# Patient Record
Sex: Male | Born: 1990 | Race: White | Hispanic: No | Marital: Single | Smoking: Never smoker
Health system: Southern US, Community
[De-identification: ages and names within clinical notes are randomized; demographics above are authoritative.]

---

## 2020-02-20 ENCOUNTER — Encounter: Payer: Self-pay | Admitting: Neurology

## 2020-05-18 ENCOUNTER — Ambulatory Visit: Payer: BC Managed Care – PPO | Admitting: Neurology

## 2020-08-17 ENCOUNTER — Ambulatory Visit: Payer: BC Managed Care – PPO | Admitting: Neurology

## 2021-02-18 ENCOUNTER — Encounter: Payer: Self-pay | Admitting: Neurology

## 2021-04-26 ENCOUNTER — Other Ambulatory Visit: Payer: Self-pay

## 2021-04-26 ENCOUNTER — Ambulatory Visit (INDEPENDENT_AMBULATORY_CARE_PROVIDER_SITE_OTHER): Payer: BC Managed Care – PPO | Admitting: Neurology

## 2021-04-26 ENCOUNTER — Encounter: Payer: Self-pay | Admitting: Neurology

## 2021-04-26 VITALS — BP 121/78 | HR 68 | Ht 72.0 in | Wt 247.0 lb

## 2021-04-26 DIAGNOSIS — R29898 Other symptoms and signs involving the musculoskeletal system: Secondary | ICD-10-CM | POA: Diagnosis not present

## 2021-04-26 DIAGNOSIS — R261 Paralytic gait: Secondary | ICD-10-CM | POA: Diagnosis not present

## 2021-04-26 DIAGNOSIS — G959 Disease of spinal cord, unspecified: Secondary | ICD-10-CM

## 2021-04-26 DIAGNOSIS — R258 Other abnormal involuntary movements: Secondary | ICD-10-CM

## 2021-04-26 MED ORDER — BACLOFEN 10 MG PO TABS: ORAL_TABLET | ORAL | 3 refills | Status: AC

## 2021-04-26 NOTE — Progress Notes (Signed)
[facility] Clinic Note - Initial Visit   Date: 04/26/21  Wesley Velasquez MRN: [id number] DOB: [birth year]   Dear Archie Patten Cranford,NP:   Thank you for your kind referral of Wesley Velasquez for consultation of right side numbness. Although his history is well known to you, please allow [place] to reiterate it for the purpose of our medical record. The patient was accompanied to the clinic by fiance who also provides collateral information.     History of Present Illness: Wesley Velasquez is a 31 y.o. right-handed male presenting for evaluation of right arm and leg numbness. For the past 10 years, he complains of numbness and pain involving the right arm and leg. Pain is worse with exertion.  Numbness and tingling is constant involving the forearm, hand, thigh, lower legs, and feet.  He complains of weakness involving the hand and difficulty holding items. He is falling multiple times daily, he is able stand up from his falls, but feels that any uneven ground can trigger a fall and he is unable to break it.  He walks unassisted, and legs feel stiff.  He endorses leg cramps. He has difficulty with urinary retention as well as urinary/bowel incontinence.He also has reduced senstaion over the left hand.    He was offered gabapentin, but it did not help, so stopped it.   He works as a Dispensing optician. Nonsmoker, doesnot drink alcohol.  He lives with his fiance and two children in a one-level home.  Past medical history; None   Medications:  Outpatient Encounter Medications as of 04/26/2021  Medication Sig   baclofen (LIORESAL) 10 MG tablet Start balcofen 5mg  (half-tablet) at bedtime x 1 week, then increase to half tablet twice daily.   No facility-administered encounter medications on file as of 04/26/2021.    Allergies: No Known Allergies  Family History: Family History  Problem Relation Age of Onset   Edema Mother    Diabetes Mother    Hypertension  Mother    Clotting disorder Mother    Anemia Mother    Diabetes Father    Hypertension Father    Neuropathy Father        Left Hand    Social History: Social History   Tobacco Use   Smoking status: Never   Smokeless tobacco: Never  Substance Use Topics   Alcohol use: Not Currently   Drug use: Never   Social History   Social History Narrative   Right Handed    Lives in an apartment     Vital Signs:  BP 121/78    Pulse 68    Ht 6' (1.829 m)    Wt 247 lb (112 kg)    SpO2 97%    BMI 33.50 kg/m   General Medical Exam:   General:  Well appearing, comfortable  Eyes/ENT: see cranial nerve examination.   Neck:   No carotid bruits. Respiratory:  Clear to auscultation, good air entry bilaterally.   Cardiac:  Regular rate and rhythm, no murmur.   Ext:  No edema  Neurological Exam: MENTAL STATUS including orientation to time, place, person, recent and remote memory, attention span and concentration, language, and fund of knowledge is normal.  Speech is not dysarthric.  CRANIAL NERVES: II:  No visual field defects.   III-IV-VI: Pupils equal round and reactive to light.  Normal conjugate, extra-ocular eye movements in all directions of gaze.  No nystagmus.  No ptosis.   V:  Normal facial sensation.  VII:  Normal facial symmetry and movements.  Jaw jerk, snout, and palmomental reflex is absent VIII:  Normal hearing and vestibular function.   IX-X:  Normal palatal movement.   XI:  Normal shoulder shrug and head rotation.   XII:  Normal tongue strength and range of motion, no deviation or fasciculation.  MOTOR:  Mild right hand muscle atrophy.  No fasciculations or abnormal movements.  No pronator drift.   Upper Extremity:  Right  Left  Deltoid  5/5   5/5   Biceps  5/5   5/5   Triceps  5/5   5/5   Infraspinatus 5/5  5/5  Medial pectoralis 5/5  5/5  Wrist extensors  5/5   5/5   Wrist flexors  5/5   5/5   Finger extensors  4/5   5-/5   Finger flexors  4/5   5-/5   Dorsal  interossei  4/5   5-/5   Abductor pollicis  4/5   5-/5   Tone (Ashworth scale)  0+  0+   Lower Extremity:  Right  Left  Hip flexors  5/5   5/5   Hip extensors  5/5   5/5   Adductor 5/5  5/5  Abductor 5/5  5/5  Knee flexors  5/5   5/5   Knee extensors  5/5   5/5   Dorsiflexors  5/5   5/5   Plantarflexors  5/5   5/5   Toe extensors  5/5   5/5   Toe flexors  5/5   5/5   Tone (Ashworth scale)  1+  1+   MSRs:  Right        Left                  brachioradialis 3+  3+  biceps 3+  3+  triceps 3+  3+  patellar 3+  3+  ankle jerk 3+  3+  Hoffman yes  yes  plantar response up  up  Bilateral sustained ankle clonus Spread of medial pectoralis reflex to finger flexors  SENSORY:  Diffusely absent sensation to temperature, pin prick, and vibration on the right arm and leg, as compared to the left.   COORDINATION/GAIT: Normal finger-to- nose-finger.  Finger tapping is severely slowed with reduced amplitude. Spastic appearing gait, slow, unsteady  IMPRESSION: Cervical myelopathy manifesting with progressive right side hemisensory loss, upper extremity weakness, autonomic dysfunction, with exam showing prominent upper motor neuron findings.  Need to obtain ASAP MRI cervical spine wwo contrast to evaluate for compressive pathology vs other cord disease such as demyelinating disease.   Start baclofen 5mg  at bedtime x 1 week, then increase to 5mg  BID.  Titrate further as needed for spasticity Fall precautions discussed  Further recommendations pending results.   Thank you for allowing me to participate in patient's care.  If I can answer any additional questions, I would be pleased to do so.    Sincerely,    Jaleya Pebley K. , DO

## 2021-04-26 NOTE — Patient Instructions (Addendum)
MRI cervical spine wwo contrast  Start baclofen 5mg  at bedtime x 1 week, then increase to 5mg  twice daily

## 2021-04-29 ENCOUNTER — Ambulatory Visit
Admission: RE | Admit: 2021-04-29 | Discharge: 2021-04-29 | Disposition: A | Discharge status: Home / Self Care | Payer: BC Managed Care – PPO | Source: Ambulatory Visit | Attending: Neurology | Admitting: Neurology

## 2021-04-29 ENCOUNTER — Other Ambulatory Visit: Payer: Self-pay | Admitting: Neurology

## 2021-04-29 ENCOUNTER — Telehealth: Payer: Self-pay

## 2021-04-29 ENCOUNTER — Telehealth: Payer: Self-pay | Admitting: Neurology

## 2021-04-29 ENCOUNTER — Other Ambulatory Visit: Payer: Self-pay

## 2021-04-29 DIAGNOSIS — R29898 Other symptoms and signs involving the musculoskeletal system: Secondary | ICD-10-CM

## 2021-04-29 DIAGNOSIS — R258 Other abnormal involuntary movements: Secondary | ICD-10-CM

## 2021-04-29 DIAGNOSIS — R937 Abnormal findings on diagnostic imaging of other parts of musculoskeletal system: Secondary | ICD-10-CM

## 2021-04-29 DIAGNOSIS — G959 Disease of spinal cord, unspecified: Secondary | ICD-10-CM

## 2021-04-29 DIAGNOSIS — R261 Paralytic gait: Secondary | ICD-10-CM

## 2021-04-29 DIAGNOSIS — G939 Disorder of brain, unspecified: Secondary | ICD-10-CM

## 2021-04-29 IMAGING — CR DG ORBITS FOR FOREIGN BODY
2 series · 2 of 2 positions shown · non-contrast
Comparison: None.

CLINICAL DATA: Metal working/exposure; clearance prior to MRI

EXAM:
ORBITS FOR FOREIGN BODY - 2 VIEW

[w orbit pa (1 of 2)]
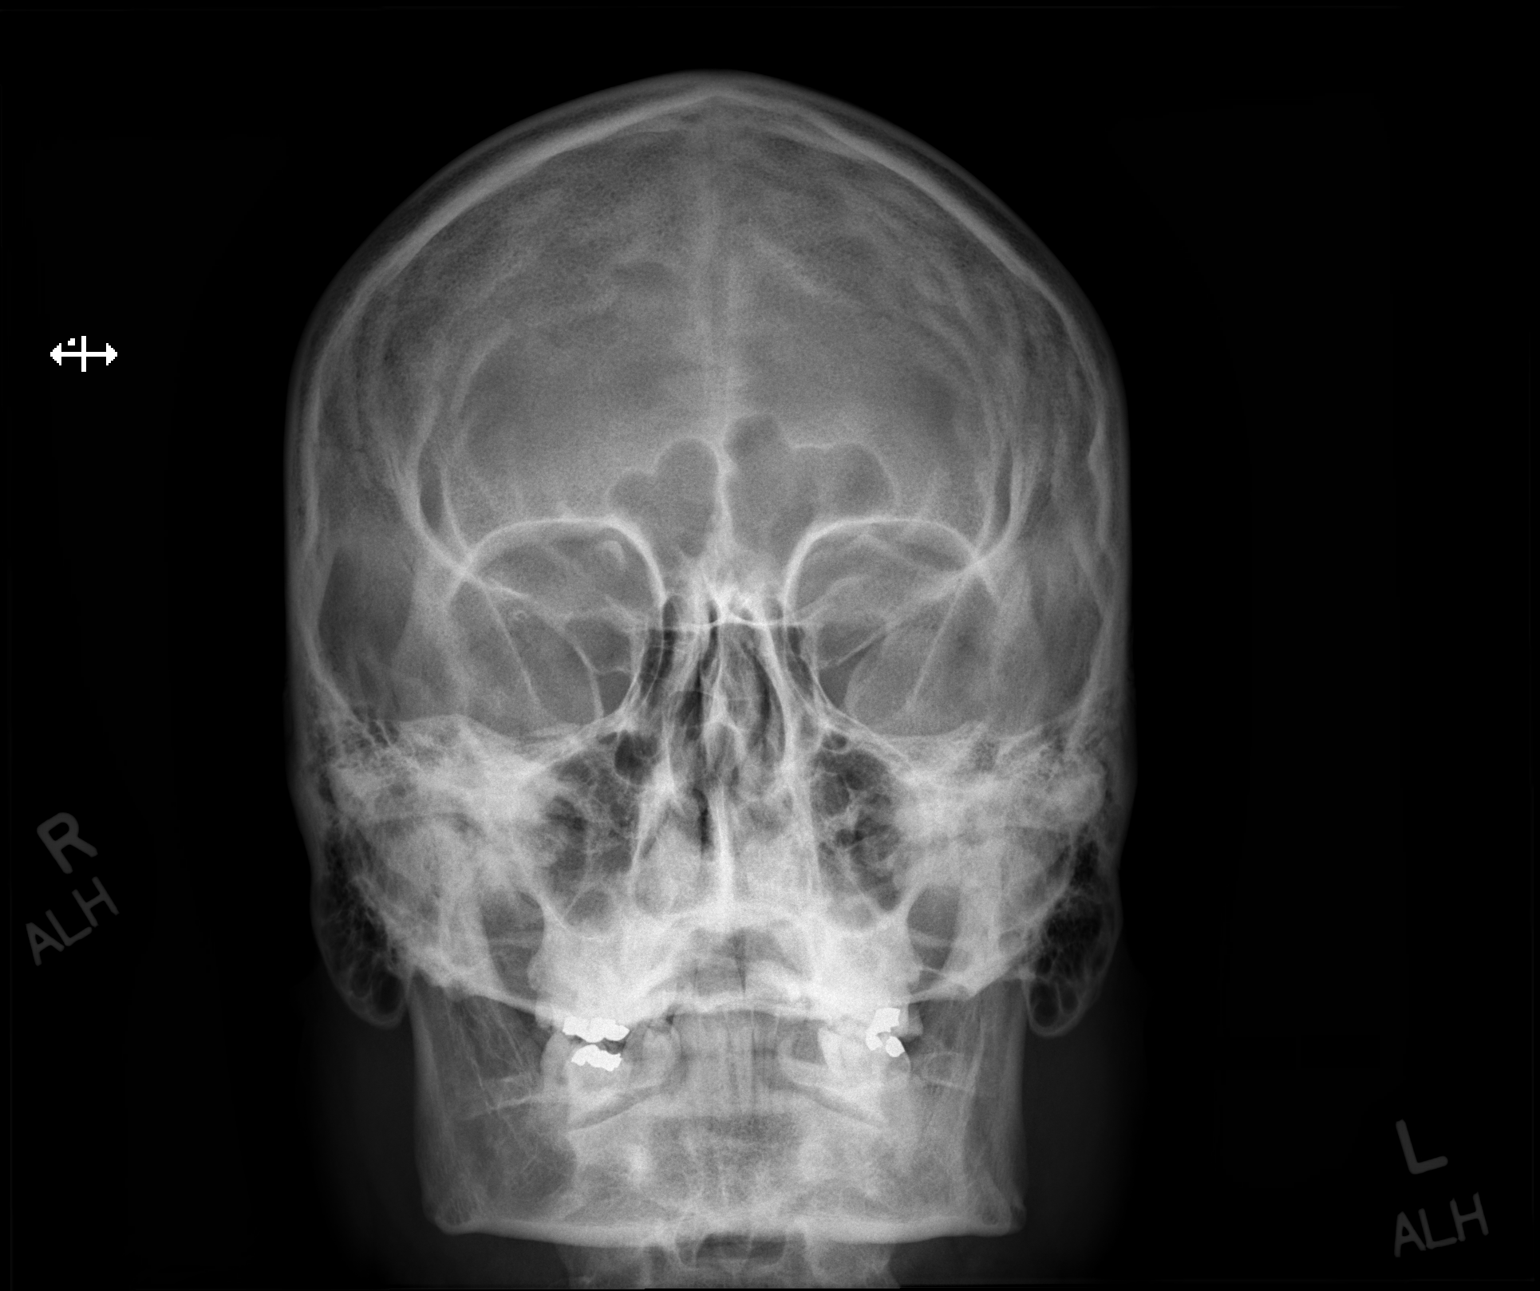

[w orbit pa (2 of 2)]
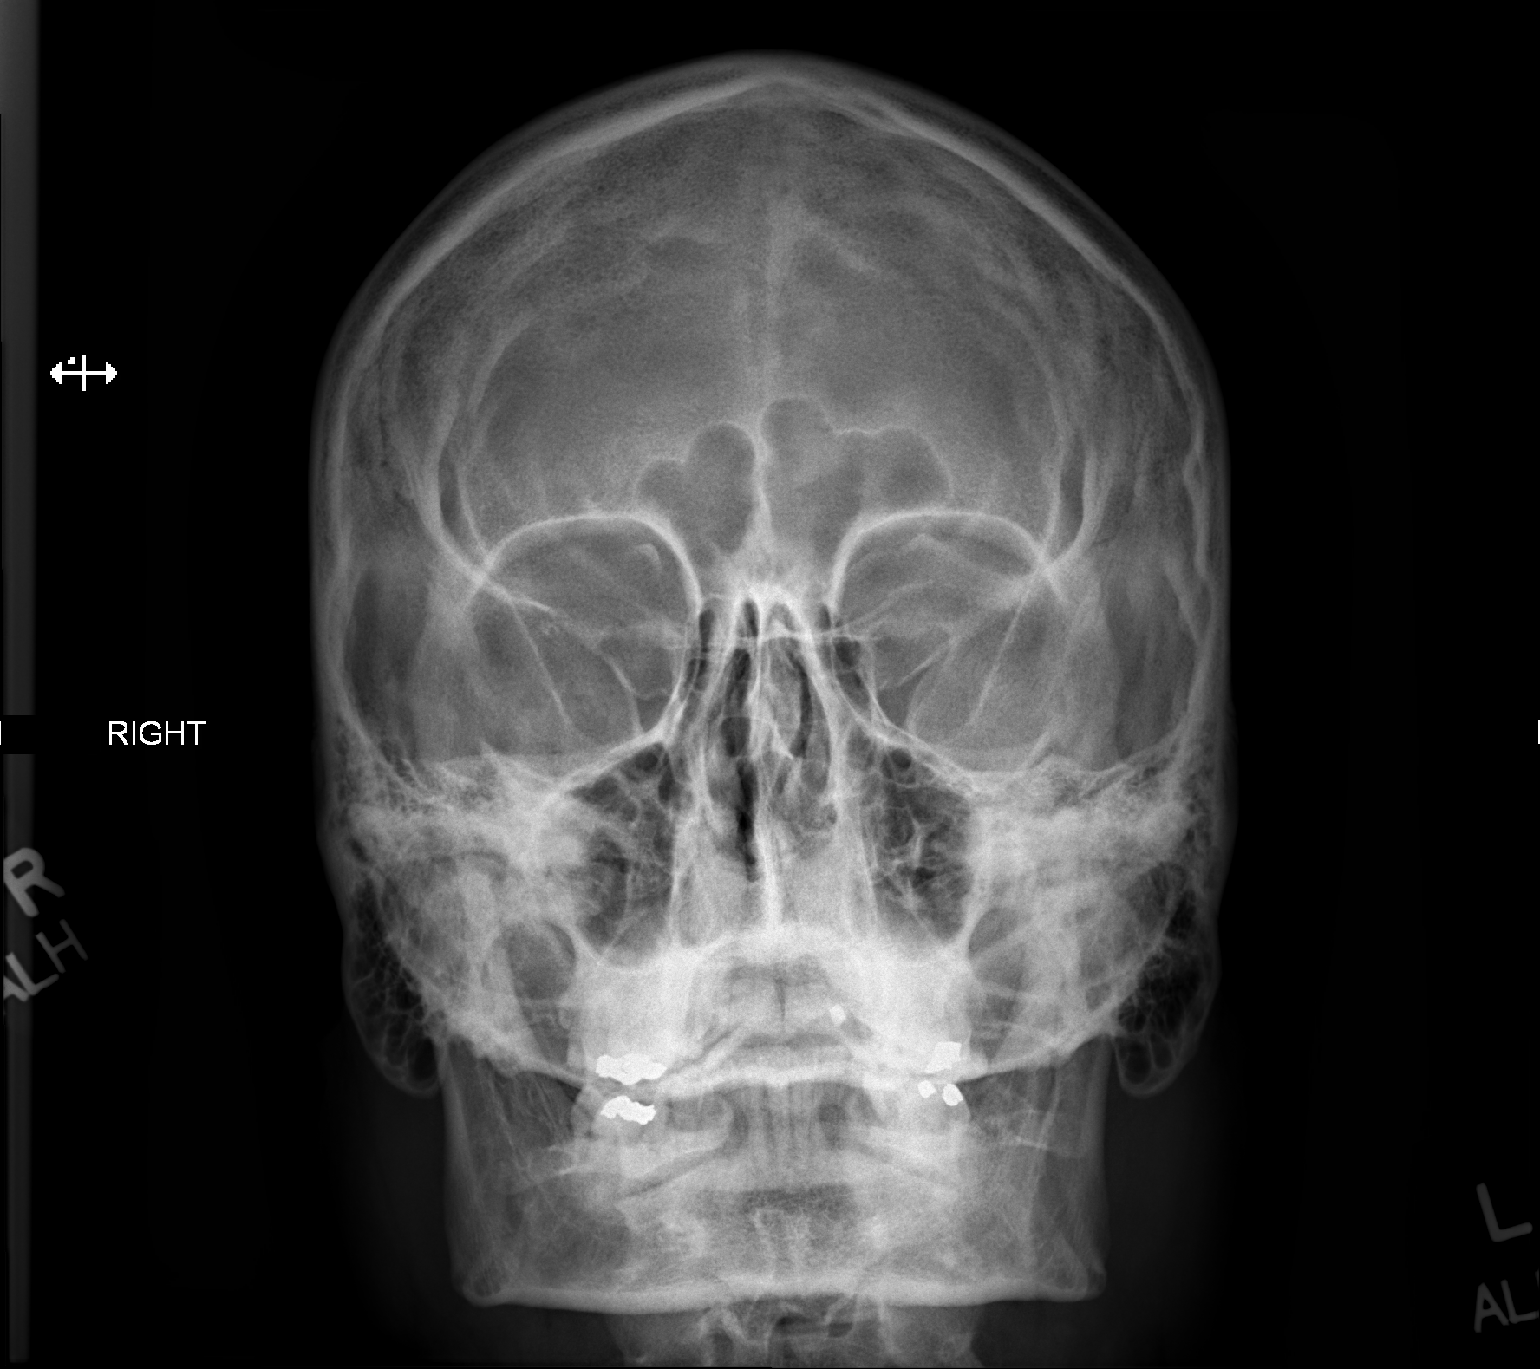

[2 of 2 positions shown; findings below may reference images not displayed]

FINDINGS: There is no evidence of metallic foreign body within the orbits. No
significant bone abnormality identified.
IMPRESSION: No evidence of metallic foreign body within the orbits.

## 2021-04-29 IMAGING — MR MR CERVICAL SPINE WO/W CM
4 of 8 series · 24 of 48 positions shown · IV contrast (20 ml multihance)
Comparison: None.
COMPARISON: None.

Addendum:
CLINICAL DATA: Myelopathy, acute, cervical spine

EXAM:
MRI CERVICAL SPINE WITHOUT AND WITH CONTRAST
TECHNIQUE: Multiplanar and multiecho pulse sequences of the cervical spine, to
include the craniocervical junction and cervicothoracic junction,
were obtained without and with intravenous contrast.
CONTRAST:  20mL MULTIHANCE GADOBENATE DIMEGLUMINE 529 MG/ML IV SOLN

[Series 5: T1 · sagittal · 3.0mm · 0.66mm/px · 4 of 15 slices shown (1 of 2)]
[im 1/15]
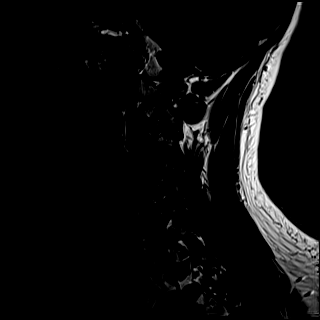
[im 5/15]
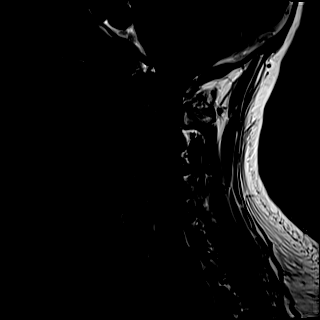
[im 10/15]
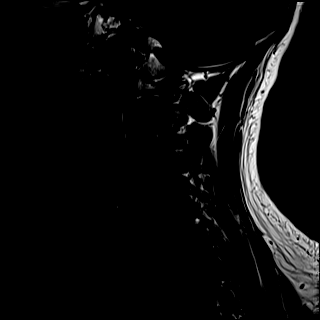
[im 15/15]
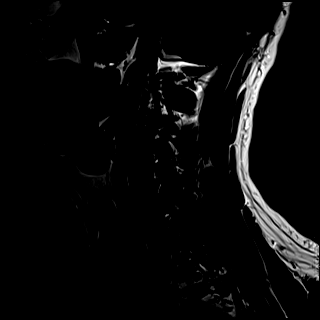

[Series 7: T2 · axial · 3.0mm · 0.50mm/px · z∈[-33,+73]mm · 8 of 34 slices shown]
[im 1/34]
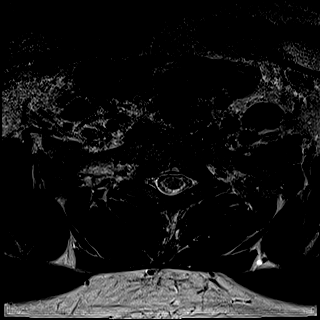
[im 5/34]
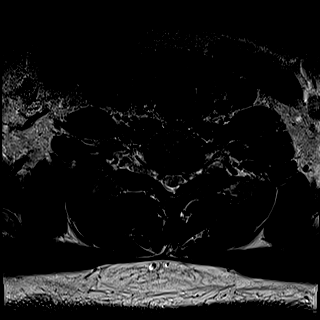
[im 10/34]
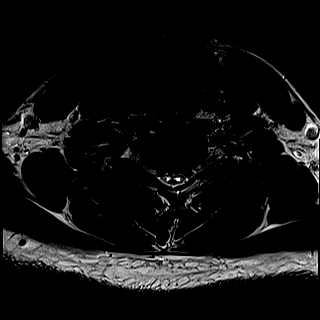
[im 15/34]
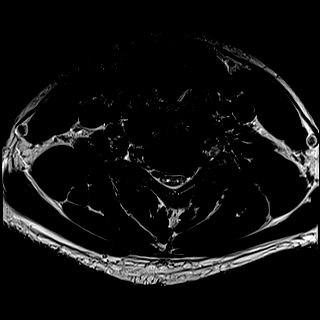
[im 19/34]
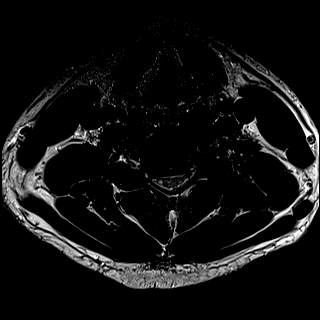
[im 24/34]
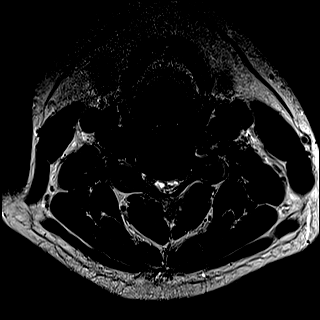
[im 29/34]
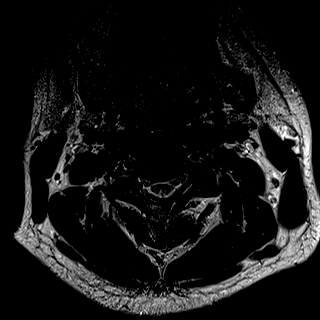
[im 34/34]
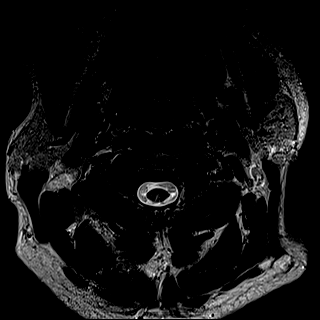

[Series 9: T1 · axial · non-contrast · 3.0mm · 0.31mm/px · z∈[-33,+73]mm · 8 of 34 slices shown (2 of 2)]
[im 1/34]
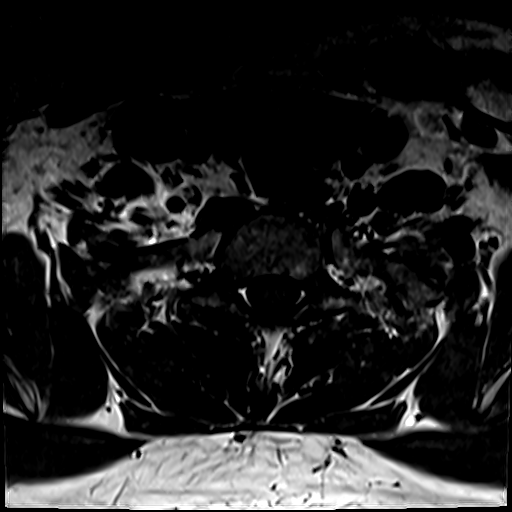
[im 5/34]
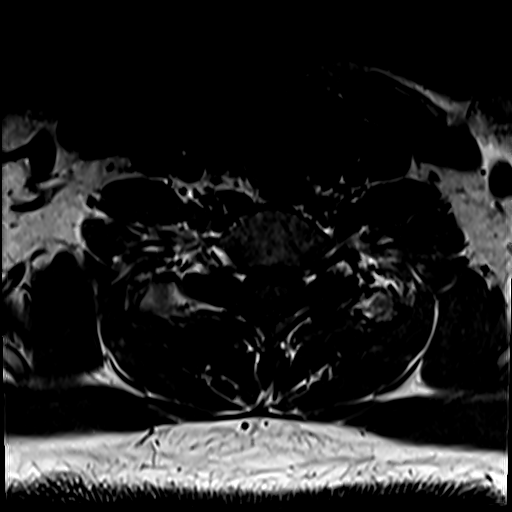
[im 10/34]
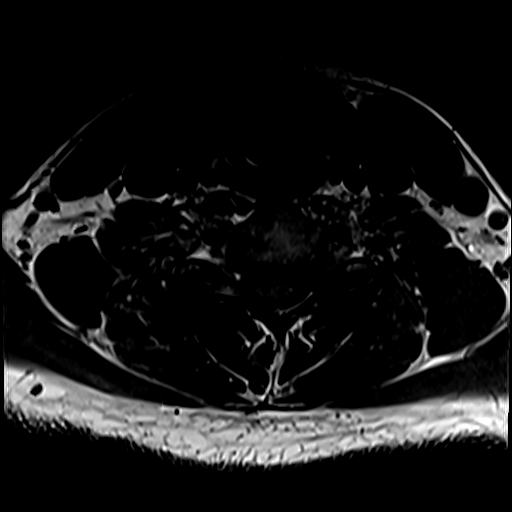
[im 15/34]
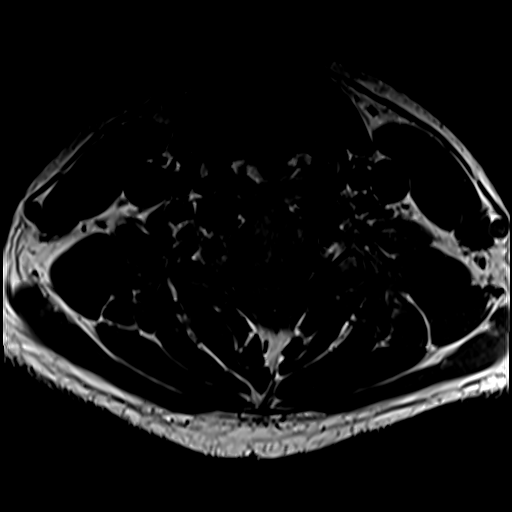
[im 19/34]
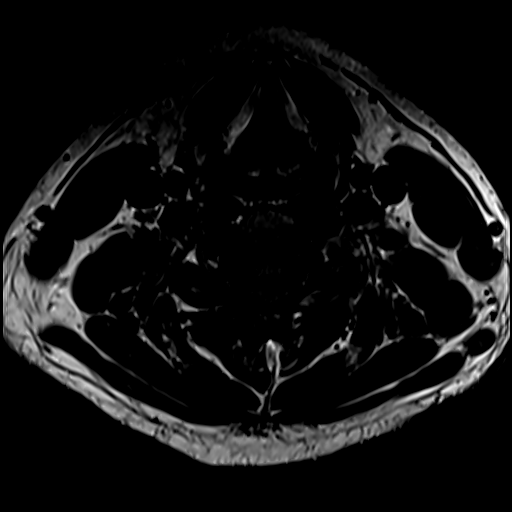
[im 24/34]
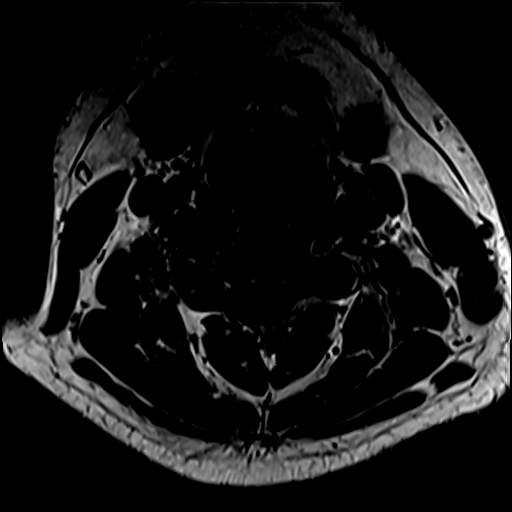
[im 29/34]
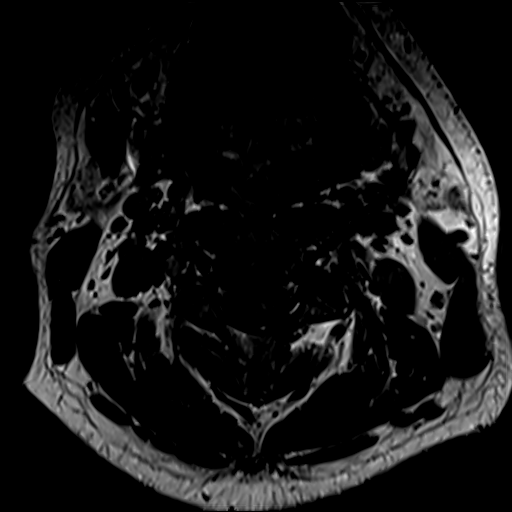
[im 34/34]
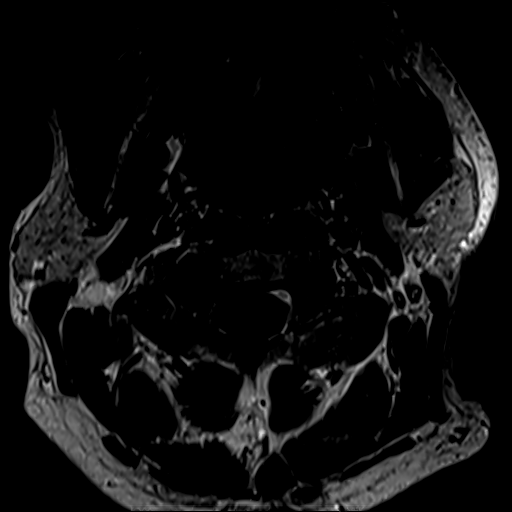

[Series 10: T2 post-contrast · sagittal · 3.0mm · 0.55mm/px · 4 of 15 slices shown]
[im 1/15]
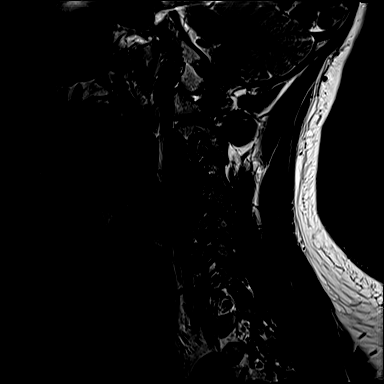
[im 5/15]
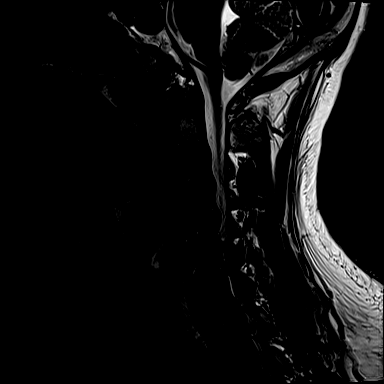
[im 10/15]
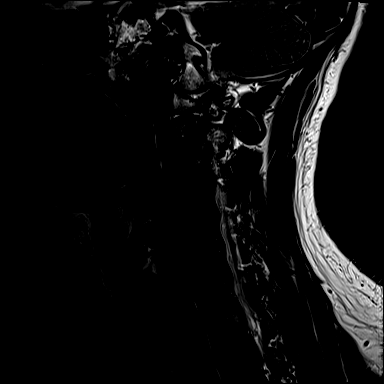
[im 15/15]
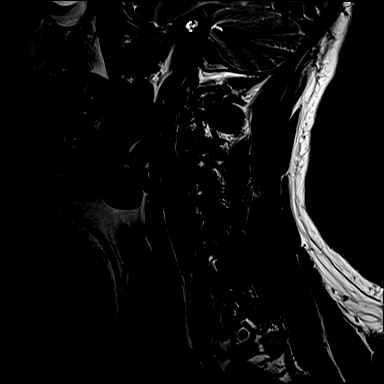

[24 of 48 positions shown; findings below may reference images not displayed]

FINDINGS: Alignment: Straightening of the normal cervical lordosis. No
substantial sagittal subluxation.

Vertebrae: Vertebral body heights are maintained. No focal marrow
edema to suggest acute fracture or discitis/osteomyelitis. No
suspicious bone lesions.

Cord: Abnormal T2/stir hyperintense signal in the cord, which is
most conspicuous at C3-C4, C5-C6 and C6-C7 with additional areas of
faint cord signal hyperintensity in the more superior and inferior
cervical cord. No abnormal cord enhancement. Cord atrophy at C3-C4
and at C6.

Posterior Fossa, vertebral arteries, paraspinal tissues: Visualized
vertebral artery flow voids are maintained. Unremarkable appearance
of the visualized posterior fossa on limited assessment.

Disc levels:

C2-C3: Posterior disc osteophyte complex with left greater than
right facet and uncovertebral hypertrophy. Ligamentum flavum
thickening. Resulting mild canal stenosis with moderate left
foraminal stenosis. No significant right foraminal stenosis.

C3-C4: Posterior disc osteophyte complex with left greater than
right facet and uncovertebral hypertrophy. Ligamentum flavum
thickening. Resulting moderate canal stenosis and severe left and
mild right foraminal stenosis.

C4-C5: Posterior disc osteophyte complex with bilateral facet and
uncovertebral hypertrophy. Resulting moderate bilateral foraminal
stenosis and mild to moderate canal stenosis.

C5-C6: Posterior disc osteophyte complex with left greater than
right facet and uncovertebral hypertrophy. Resulting mild to
moderate canal stenosis and mild left greater than right foraminal
stenosis.

C6-C7: Posterior disc osteophyte complex with mild to moderate canal
stenosis. Bilateral uncovertebral hypertrophy with mild to moderate
right and mild left foraminal stenosis.

C7-T1: No significant disc protrusion, foraminal stenosis, or canal
stenosis.
IMPRESSION: 1. Abnormal T2/STIR hyperintense signal in the cord, which is most
conspicuous at C3-C4, C5-C6 and C6-C7 with cord atrophy and
additional areas of faint cord signal hyperintensity in the more
superior and inferior posterior cervical cord. No abnormal cord
enhancement. Findings are most likely secondary to compressive
myelopathy with myelomalacia and potentially edema given the
correlate canal stenosis described below, but the degree of cord
signal abnormality is out of proportion to current stenosis and more
longitudinally extensive, possibly related to prior nonspecific
insult (such as cord infarct or trauma).
2. Moderate canal stenosis at C3-C4 and mild-to-moderate canal
stenosis at C4-C5, C5-C6 and C6-C7.
3. Severe left foraminal stenosis C3-C4. Moderate foraminal stenosis
on the left at C2-C3 and bilaterally at C4-C5. Mild-to-moderate
right foraminal stenosis C6-C7.

ADDENDUM:
Findings discussed with Dr. MATTIS via telephone at [DATE] p.m.

*** End of Addendum ***
FINDINGS: Alignment: Straightening of the normal cervical lordosis. No
substantial sagittal subluxation.

Vertebrae: Vertebral body heights are maintained. No focal marrow
edema to suggest acute fracture or discitis/osteomyelitis. No
suspicious bone lesions.

Cord: Abnormal T2/stir hyperintense signal in the cord, which is
most conspicuous at C3-C4, C5-C6 and C6-C7 with additional areas of
faint cord signal hyperintensity in the more superior and inferior
cervical cord. No abnormal cord enhancement. Cord atrophy at C3-C4
and at C6.

Posterior Fossa, vertebral arteries, paraspinal tissues: Visualized
vertebral artery flow voids are maintained. Unremarkable appearance
of the visualized posterior fossa on limited assessment.

Disc levels:

C2-C3: Posterior disc osteophyte complex with left greater than
right facet and uncovertebral hypertrophy. Ligamentum flavum
thickening. Resulting mild canal stenosis with moderate left
foraminal stenosis. No significant right foraminal stenosis.

C3-C4: Posterior disc osteophyte complex with left greater than
right facet and uncovertebral hypertrophy. Ligamentum flavum
thickening. Resulting moderate canal stenosis and severe left and
mild right foraminal stenosis.

C4-C5: Posterior disc osteophyte complex with bilateral facet and
uncovertebral hypertrophy. Resulting moderate bilateral foraminal
stenosis and mild to moderate canal stenosis.

C5-C6: Posterior disc osteophyte complex with left greater than
right facet and uncovertebral hypertrophy. Resulting mild to
moderate canal stenosis and mild left greater than right foraminal
stenosis.

C6-C7: Posterior disc osteophyte complex with mild to moderate canal
stenosis. Bilateral uncovertebral hypertrophy with mild to moderate
right and mild left foraminal stenosis.

C7-T1: No significant disc protrusion, foraminal stenosis, or canal
stenosis.
IMPRESSION: 1. Abnormal T2/STIR hyperintense signal in the cord, which is most
conspicuous at C3-C4, C5-C6 and C6-C7 with cord atrophy and
additional areas of faint cord signal hyperintensity in the more
superior and inferior posterior cervical cord. No abnormal cord
enhancement. Findings are most likely secondary to compressive
myelopathy with myelomalacia and potentially edema given the
correlate canal stenosis described below, but the degree of cord
signal abnormality is out of proportion to current stenosis and more
longitudinally extensive, possibly related to prior nonspecific
insult (such as cord infarct or trauma).
2. Moderate canal stenosis at C3-C4 and mild-to-moderate canal
stenosis at C4-C5, C5-C6 and C6-C7.
3. Severe left foraminal stenosis C3-C4. Moderate foraminal stenosis
on the left at C2-C3 and bilaterally at C4-C5. Mild-to-moderate
right foraminal stenosis C6-C7.

## 2021-04-29 MED ORDER — GADOBENATE DIMEGLUMINE 529 MG/ML IV SOLN
20.0000 mL | Freq: Once | INTRAVENOUS | Status: AC | PRN
  Administered 2021-04-29: 20 mL via INTRAVENOUS

## 2021-04-29 NOTE — Telephone Encounter (Signed)
Received call from Radiology requesting STAT peer to peer. Provided them with on call physician Dr. Rosalyn Gess cell phone number to call her for peer to peer.

## 2021-04-29 NOTE — Telephone Encounter (Signed)
Ruby from Dr Yetta Barre radiologist office called about this mutual patient.  They were calling to connect with Dr. Phone # [phone]

## 2021-04-29 NOTE — Telephone Encounter (Signed)
Returned call to Dr. Yetta Barre and discuss the results of his MRI findings. Patient with extensive cord changes and atrophy, concerning for prior insult. There is no abnormal enhancement. To be complete, I will order MRI brain with and without contrast and NMO antibody.

## 2021-04-30 ENCOUNTER — Other Ambulatory Visit: Payer: BC Managed Care – PPO

## 2021-04-30 NOTE — Telephone Encounter (Signed)
Orders have been placed and MRI has been faxed to [facility] imaging for Urgent appointment.

## 2021-04-30 NOTE — Telephone Encounter (Signed)
Called patient and informed him that MRI shows abnormalities in the cervical cord, which seem to be long-standing. It may be due to compression at these levels, but to be sure, there is no other process Informed patient that Dr. Allena Katz would like to check MRI brain with and without contrast and NMO antibody. Explained to patient that we are doing additional testing and will be in touch once we receive results.  Patient is aware to come to our office to check in for lab and was provided lab hours. Patient verbalized understanding of all explained to him and recommendations and had no further questions or concerns.

## 2021-04-30 NOTE — Addendum Note (Signed)
Addended by: Karl Luke A on: 04/30/2021 09:14 AM   Modules accepted: Orders

## 2021-05-03 LAB — NEUROMYELITIS OPTICA AUTOAB, IGG: NMO IgG Autoantibodies: <1.5 U/mL (ref 0.0–3.0)

## 2021-05-04 ENCOUNTER — Ambulatory Visit
Admission: RE | Admit: 2021-05-04 | Discharge: 2021-05-04 | Disposition: A | Discharge status: Home / Self Care | Payer: BC Managed Care – PPO | Source: Ambulatory Visit | Attending: Neurology | Admitting: Neurology

## 2021-05-04 ENCOUNTER — Other Ambulatory Visit: Payer: Self-pay

## 2021-05-04 DIAGNOSIS — R258 Other abnormal involuntary movements: Secondary | ICD-10-CM

## 2021-05-04 DIAGNOSIS — R261 Paralytic gait: Secondary | ICD-10-CM

## 2021-05-04 DIAGNOSIS — R29898 Other symptoms and signs involving the musculoskeletal system: Secondary | ICD-10-CM

## 2021-05-04 DIAGNOSIS — G939 Disorder of brain, unspecified: Secondary | ICD-10-CM

## 2021-05-04 IMAGING — MR MR HEAD WO/W CM
14 series · 48 of 48 positions shown · IV contrast (multihance)
Comparison: Head CT [DATE].  Cervical spine MRI [DATE].

CLINICAL DATA: Provided history: White-matter lesions of central
nervous system. Abnormal cervical MRI, extensive cord changes.
Spastic gait. Clonus. Right hand weakness. Additional history
provided by scanning technologist: Patient reports numbness on right
side of body from shoulders down, right hand weakness.

EXAM:
MRI HEAD WITHOUT AND WITH CONTRAST
TECHNIQUE: Multiplanar, multiecho pulse sequences of the brain and surrounding
structures were obtained without and with intravenous contrast.
CONTRAST:  20mL MULTIHANCE GADOBENATE DIMEGLUMINE 529 MG/ML IV SOLN

[Series 5: T1 · sagittal · 4.0mm · 0.75mm/px · 1 of 27 slices shown (1 of 3)]
[im 1/27]
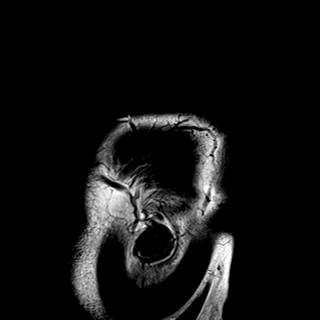

[Series 6: DWI · axial · 3.0mm · 0.94mm/px · z∈[-62,+83]mm · 8 of 166 slices shown (1 of 3)]
[im 1/166]
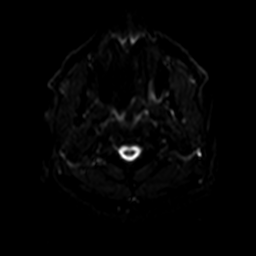
[im 24/166]
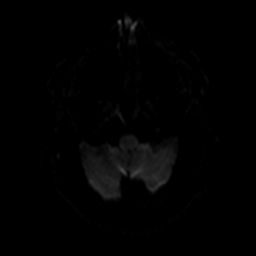
[im 48/166]
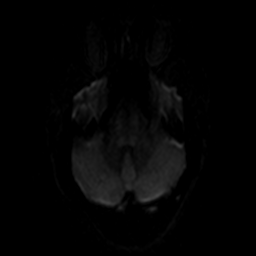
[im 71/166]
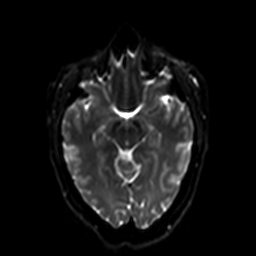
[im 95/166]
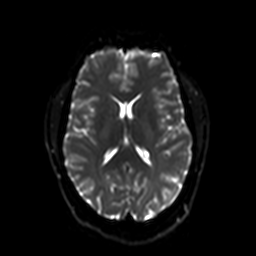
[im 118/166]
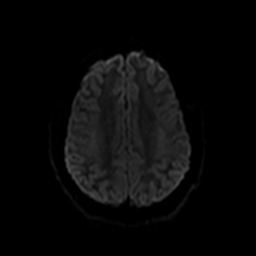
[im 142/166]
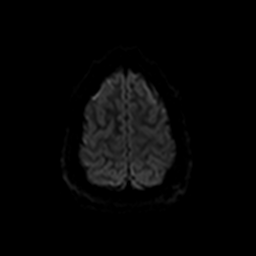
[im 166/166]
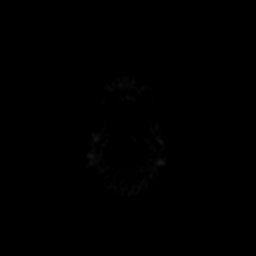

[Series 7: ax dwi_tracew · axial · 3.0mm · 0.94mm/px · z∈[-62,+83]mm · 4 of 84 slices shown]
[im 1/84]
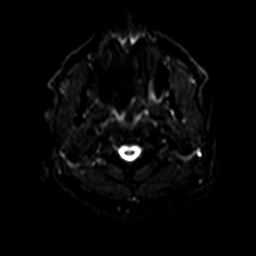
[im 28/84]
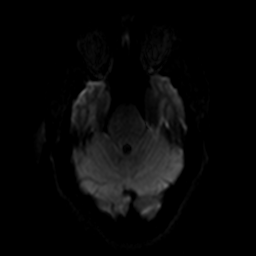
[im 56/84]
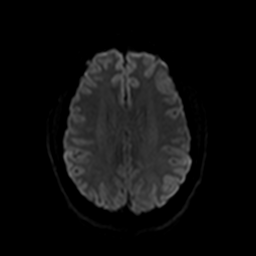
[im 84/84]
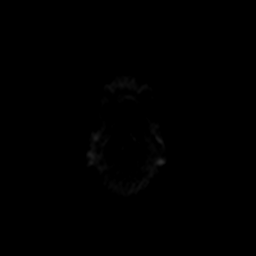

[Series 8: ax dwi_adc · axial · 3.0mm · 0.94mm/px · z∈[-62,+83]mm · 2 of 42 slices shown]
[im 1/42]
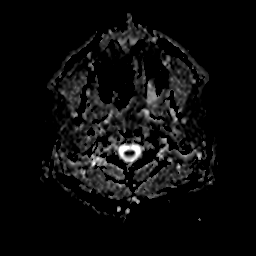
[im 42/42]
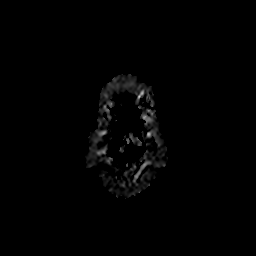

[Series 9: DWI · coronal · 5.0mm · 1.44mm/px · 3 of 64 slices shown (2 of 3)]
[im 1/64]
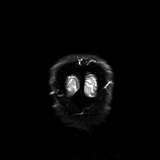
[im 32/64]
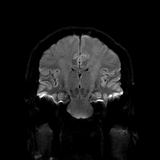
[im 64/64]
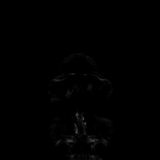

[Series 10: DWI · coronal · 5.0mm · 1.44mm/px · 2 of 32 slices shown (3 of 3)]
[im 1/32]
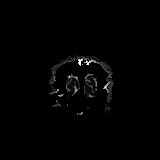
[im 32/32]
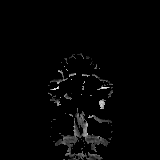

[Series 11: T2 · axial · 4.0mm · 0.36mm/px · 1 of 30 slices shown]
[im 1/30]
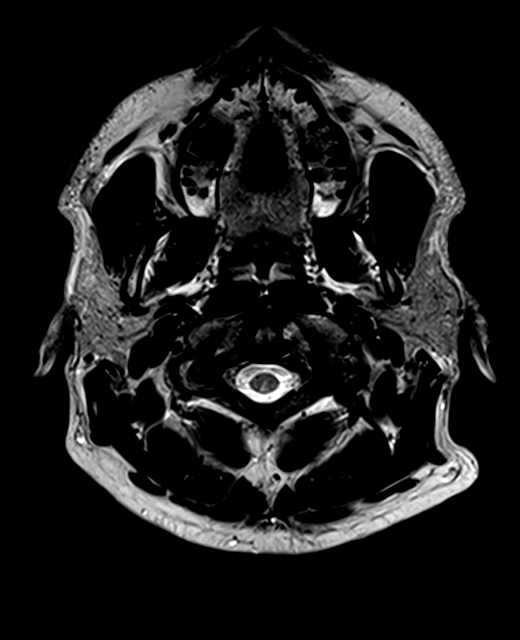

[Series 12: FLAIR · axial · 3.0mm · 0.72mm/px · 1 of 26 slices shown (1 of 2)]
[im 1/26]
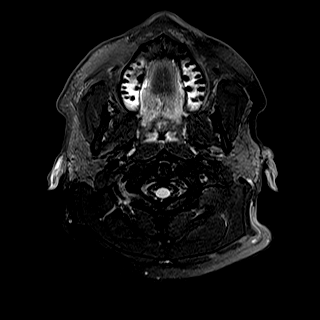

[Series 13: FLAIR · sagittal · 4.0mm · 0.72mm/px · 1 of 24 slices shown (2 of 2)]
[im 1/24]
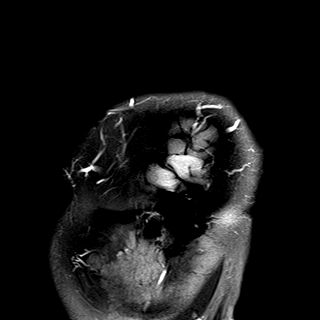

[Series 14: swi_images · axial · 1.5mm · 0.90mm/px · z∈[-68,+73]mm · 5 of 96 slices shown]
[im 1/96]
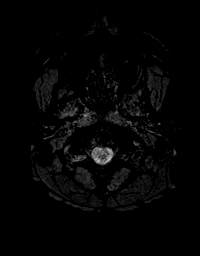
[im 24/96]
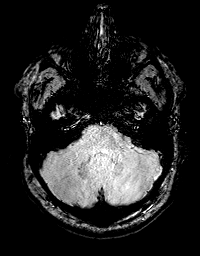
[im 48/96]
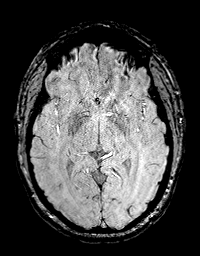
[im 72/96]
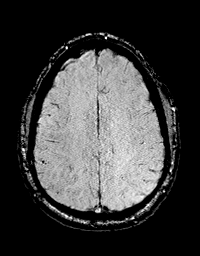
[im 96/96]
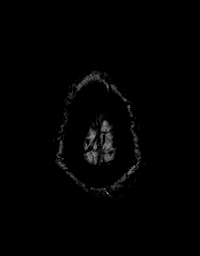

[Series 16: T1 · axial · 1.0mm · 0.94mm/px · z∈[-75,+82]mm · 8 of 160 slices shown (2 of 3)]
[im 1/160]
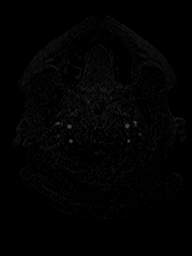
[im 23/160]
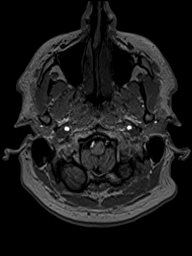
[im 46/160]
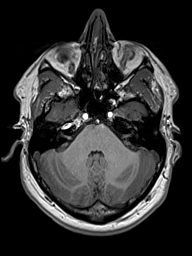
[im 69/160]
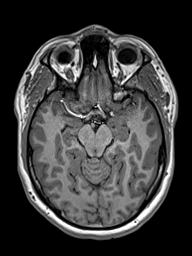
[im 91/160]
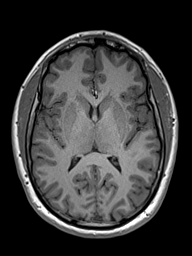
[im 114/160]
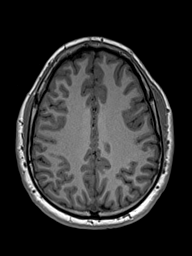
[im 137/160]
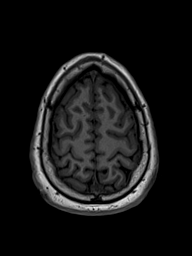
[im 160/160]
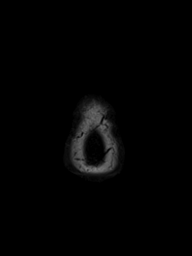

[Series 17: T2 post-contrast · coronal · 4.0mm · 0.36mm/px · 2 of 35 slices shown]
[im 1/35]
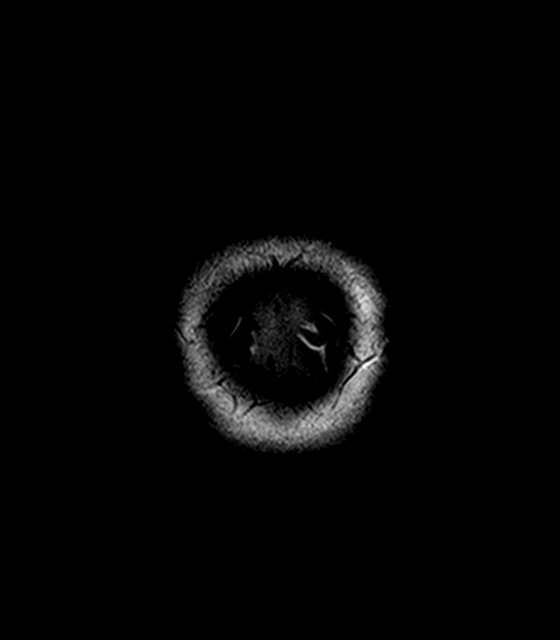
[im 35/35]
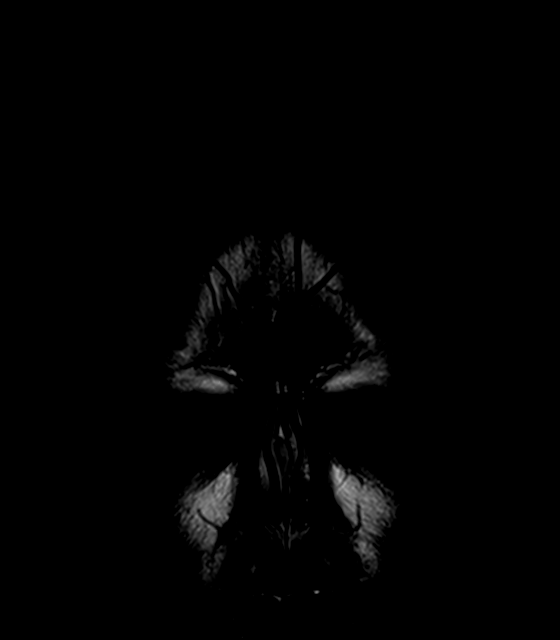

[Series 18: T1 · axial · 1.0mm · 0.94mm/px · z∈[-75,+82]mm · 8 of 160 slices shown (3 of 3)]
[im 1/160]
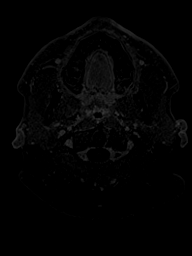
[im 23/160]
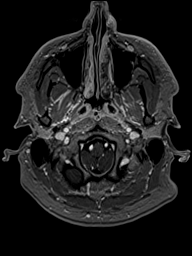
[im 46/160]
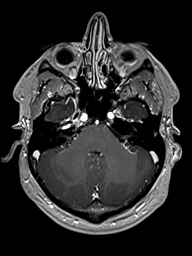
[im 69/160]
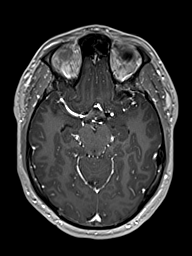
[im 91/160]
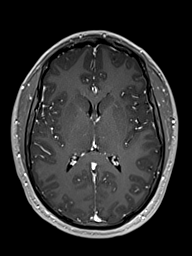
[im 114/160]
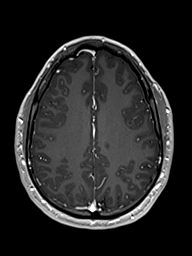
[im 137/160]
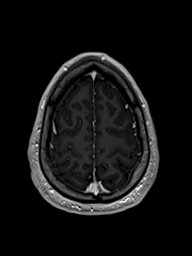
[im 160/160]
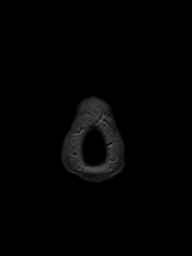

[Series 19: T1 post-contrast · coronal · 4.0mm · 0.72mm/px · 2 of 35 slices shown]
[im 1/35]
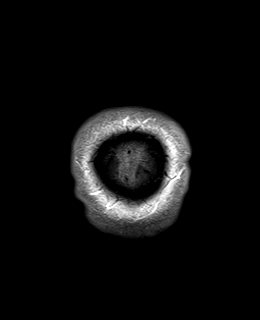
[im 35/35]
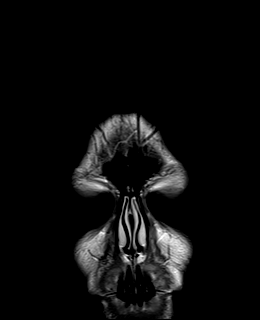

[48 of 48 positions shown; findings below may reference images not displayed]

FINDINGS: Brain:

Cerebral volume is normal.

No cortical encephalomalacia is identified. No significant cerebral
white matter disease.

There is no acute infarct.

No evidence of an intracranial mass.

No chronic intracranial blood products.

No extra-axial fluid collection.

No midline shift.

No pathologic intracranial enhancement identified.

Vascular: Maintained flow voids within the proximal large arterial
vessels.

Skull and upper cervical spine: No focal suspicious marrow lesion.

Sinuses/Orbits: Visualized orbits show no acute finding. Small
mucous retention cyst within the left maxillary sinus. Trace mucosal
thickening within the left sphenoid and bilateral ethmoid sinuses.

Other: Foci of signal abnormality and volume loss within the
cervical spinal cord were better appreciated on the recent prior
cervical spine MRI of [DATE].
IMPRESSION: Unremarkable MRI appearance of the brain. No evidence of acute
intracranial abnormality.

Mild paranasal sinus disease, as described.

## 2021-05-04 MED ORDER — GADOBENATE DIMEGLUMINE 529 MG/ML IV SOLN
20.0000 mL | Freq: Once | INTRAVENOUS | Status: AC | PRN
  Administered 2021-05-04: 20 mL via INTRAVENOUS

## 2021-05-05 ENCOUNTER — Telehealth: Payer: Self-pay | Admitting: Neurology

## 2021-05-05 ENCOUNTER — Other Ambulatory Visit: Payer: Self-pay

## 2021-05-05 DIAGNOSIS — R261 Paralytic gait: Secondary | ICD-10-CM

## 2021-05-05 DIAGNOSIS — G959 Disease of spinal cord, unspecified: Secondary | ICD-10-CM

## 2021-05-05 DIAGNOSIS — G939 Disorder of brain, unspecified: Secondary | ICD-10-CM

## 2021-05-05 DIAGNOSIS — R937 Abnormal findings on diagnostic imaging of other parts of musculoskeletal system: Secondary | ICD-10-CM

## 2021-05-05 NOTE — Telephone Encounter (Signed)
Called and discussed patient of his imaging results.    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  There is moderate canal stenosis at C3-4.   MRI brain is normal, no signs of demyelinating disease.  NMO antibody is negative.  Although symptoms have been ongoing for 10 years, he clearly states that symptoms (paresthesias, imbalance, incontinence, etc) are getting worse over the past two years. Therefore, I will seek the opinion of neurosurgery.  I also informed him that going forward, he may need to see PM&R for chronic management of symptoms, namely spasticity.  Wesley Winner K. Allena Katz, DO

## 2021-06-30 ENCOUNTER — Telehealth: Payer: Self-pay | Admitting: Neurology

## 2021-06-30 NOTE — Telephone Encounter (Signed)
Patients girlfriend called, said Himmat thinks he needs to up his baclofen. She isnt sure if patel can do that or if he needs to call the surgeon she referred him to.  ?

## 2021-07-01 NOTE — Telephone Encounter (Signed)
I left message on answer machine to call neurosurgeon, she he was now under there care. If any questions to call back.  ?
